# Patient Record
Sex: Female | Born: 1990 | Race: White | Hispanic: No | Marital: Single | Smoking: Former smoker
Health system: Southern US, Community
[De-identification: ages and names within clinical notes are randomized; demographics above are authoritative.]

---
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
## 2011-02-06 ENCOUNTER — Emergency Department: Payer: Self-pay | Admitting: Emergency Medicine

## 2011-03-31 ENCOUNTER — Emergency Department: Payer: Self-pay | Admitting: Unknown Physician Specialty

## 2011-04-10 ENCOUNTER — Inpatient Hospital Stay: Payer: Self-pay | Admitting: Psychiatry

## 2011-07-05 ENCOUNTER — Emergency Department: Payer: Self-pay | Admitting: Emergency Medicine

## 2011-08-07 ENCOUNTER — Emergency Department: Payer: Self-pay | Admitting: Emergency Medicine

## 2011-08-10 ENCOUNTER — Inpatient Hospital Stay: Payer: Self-pay | Admitting: Psychiatry

## 2012-11-26 ENCOUNTER — Ambulatory Visit: Payer: Self-pay | Admitting: Advanced Practice Midwife

## 2012-12-19 ENCOUNTER — Encounter: Payer: Self-pay | Admitting: Obstetrics and Gynecology

## 2013-01-16 ENCOUNTER — Encounter: Payer: Self-pay | Admitting: Maternal & Fetal Medicine

## 2013-01-30 ENCOUNTER — Encounter: Payer: Self-pay | Admitting: Obstetrics & Gynecology

## 2013-02-20 ENCOUNTER — Encounter: Payer: Self-pay | Admitting: Obstetrics and Gynecology

## 2013-05-22 ENCOUNTER — Ambulatory Visit: Payer: Self-pay | Admitting: Family Medicine

## 2013-06-09 ENCOUNTER — Observation Stay: Payer: Self-pay

## 2013-06-17 ENCOUNTER — Observation Stay: Payer: Self-pay | Admitting: Obstetrics and Gynecology

## 2013-06-26 ENCOUNTER — Observation Stay: Payer: Self-pay | Admitting: Obstetrics and Gynecology

## 2013-06-28 ENCOUNTER — Inpatient Hospital Stay: Payer: Self-pay | Admitting: Obstetrics and Gynecology

## 2013-06-28 LAB — CBC WITH DIFFERENTIAL/PLATELET
Basophil #: 0 10*3/uL (ref 0.0–0.1)
Eosinophil #: 0 10*3/uL (ref 0.0–0.7)
Eosinophil %: 0.1 %
HCT: 37.1 % (ref 35.0–47.0)
Lymphocyte #: 1.8 10*3/uL (ref 1.0–3.6)
Lymphocyte %: 15.2 %
MCV: 86 fL (ref 80–100)
Monocyte #: 0.6 x10 3/mm (ref 0.2–0.9)
Monocyte %: 4.9 %
Neutrophil #: 9.2 10*3/uL — ABNORMAL HIGH (ref 1.4–6.5)
Neutrophil %: 79.6 %
Platelet: 212 10*3/uL (ref 150–440)
RBC: 4.32 10*6/uL (ref 3.80–5.20)

## 2013-06-28 LAB — PROTEIN / CREATININE RATIO, URINE
Creatinine, Urine: 262.2 mg/dL — ABNORMAL HIGH (ref 30.0–125.0)
Protein, Random Urine: 16 mg/dL — ABNORMAL HIGH (ref 0–12)
Protein/Creat. Ratio: 61 mg/gCREAT (ref 0–200)

## 2013-06-28 LAB — GC/CHLAMYDIA PROBE AMP

## 2013-06-28 LAB — BASIC METABOLIC PANEL
Calcium, Total: 8.8 mg/dL (ref 8.5–10.1)
Chloride: 109 mmol/L — ABNORMAL HIGH (ref 98–107)
Co2: 21 mmol/L (ref 21–32)
Creatinine: 0.48 mg/dL — ABNORMAL LOW (ref 0.60–1.30)
EGFR (African American): >60
EGFR (Non-African Amer.): >60
Glucose: 115 mg/dL — ABNORMAL HIGH (ref 65–99)
Potassium: 3.6 mmol/L (ref 3.5–5.1)

## 2013-07-01 LAB — PATHOLOGY REPORT

## 2013-11-26 ENCOUNTER — Emergency Department: Payer: Self-pay | Admitting: Emergency Medicine

## 2013-11-26 LAB — BASIC METABOLIC PANEL
Anion Gap: 5 — ABNORMAL LOW (ref 7–16)
BUN: 10 mg/dL (ref 7–18)
Calcium, Total: 9.3 mg/dL (ref 8.5–10.1)
Chloride: 107 mmol/L (ref 98–107)
Co2: 28 mmol/L (ref 21–32)
Creatinine: 0.6 mg/dL (ref 0.60–1.30)
EGFR (Non-African Amer.): >60
GLUCOSE: 89 mg/dL (ref 65–99)
Osmolality: 278 (ref 275–301)
Potassium: 3.7 mmol/L (ref 3.5–5.1)
Sodium: 140 mmol/L (ref 136–145)

## 2013-11-26 LAB — CBC WITH DIFFERENTIAL/PLATELET
BASOS ABS: 0 10*3/uL (ref 0.0–0.1)
BASOS PCT: 0.3 %
Eosinophil #: 0 10*3/uL (ref 0.0–0.7)
Eosinophil %: 0.3 %
HCT: 42.4 % (ref 35.0–47.0)
HGB: 13.9 g/dL (ref 12.0–16.0)
Lymphocyte #: 2.8 10*3/uL (ref 1.0–3.6)
Lymphocyte %: 31.6 %
MCH: 29.3 pg (ref 26.0–34.0)
MCHC: 32.9 g/dL (ref 32.0–36.0)
MCV: 89 fL (ref 80–100)
MONOS PCT: 6.8 %
Monocyte #: 0.6 x10 3/mm (ref 0.2–0.9)
Neutrophil #: 5.4 10*3/uL (ref 1.4–6.5)
Neutrophil %: 61 %
Platelet: 222 10*3/uL (ref 150–440)
RBC: 4.76 10*6/uL (ref 3.80–5.20)
RDW: 12.7 % (ref 11.5–14.5)
WBC: 8.9 10*3/uL (ref 3.6–11.0)

## 2013-11-26 LAB — URINALYSIS, COMPLETE
BACTERIA: NONE SEEN
Bilirubin,UR: NEGATIVE
Glucose,UR: NEGATIVE mg/dL (ref 0–75)
KETONE: NEGATIVE
LEUKOCYTE ESTERASE: NEGATIVE
NITRITE: NEGATIVE
Ph: 6 (ref 4.5–8.0)
Protein: NEGATIVE
Specific Gravity: 1.02 (ref 1.003–1.030)
Squamous Epithelial: 2
WBC UR: 3 /HPF (ref 0–5)

## 2014-06-03 IMAGING — US US OB LIMITED
1 series · 13 of 13 positions shown · non-contrast
Comparison: none

REASON FOR EXAM: CALL REPORT [DATE] Amniotic Fluid Index
COMMENTS:

[Series 1: us ob limited · 0.30mm/px · 13 of 13 slices shown]
[im 1/13]
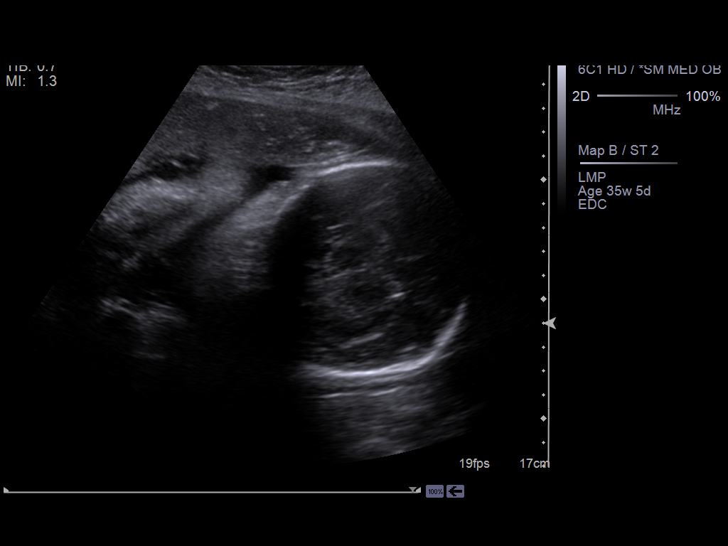
[im 2/13]
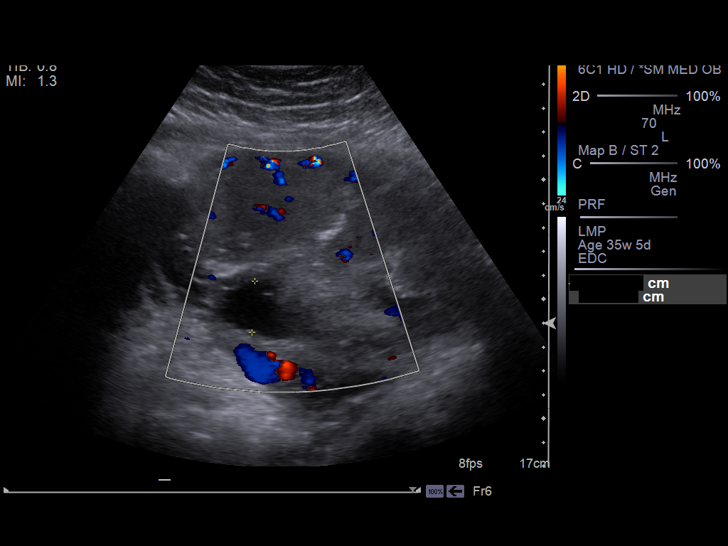
[im 3/13]
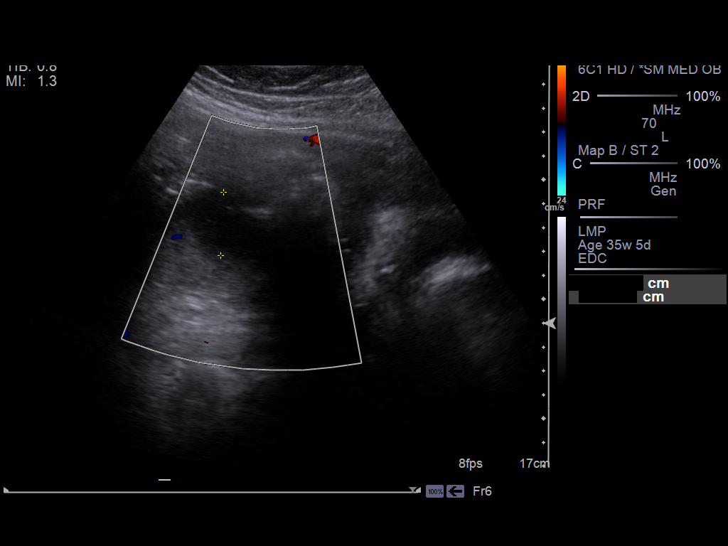
[im 4/13]
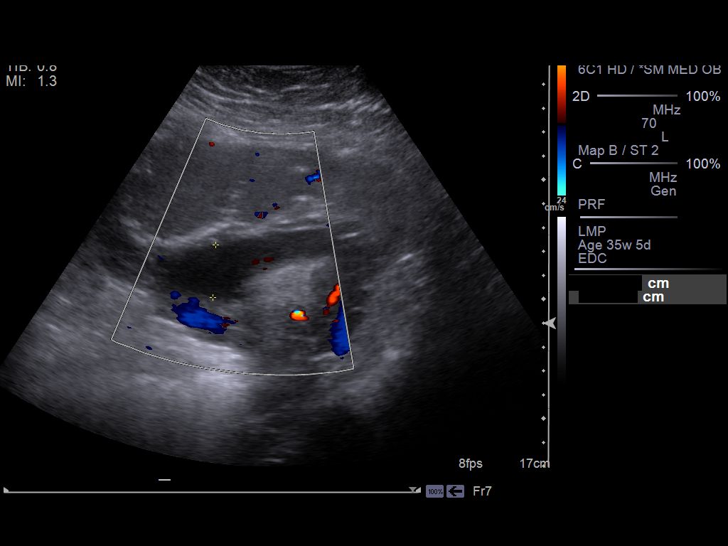
[im 5/13]
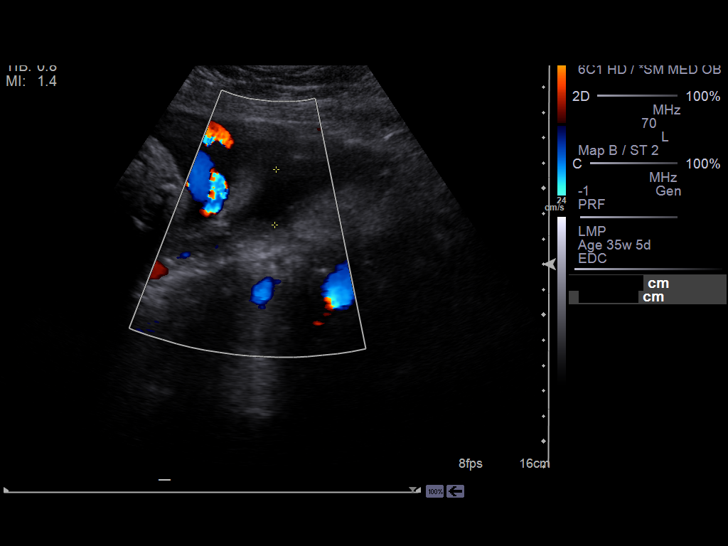
[im 6/13]
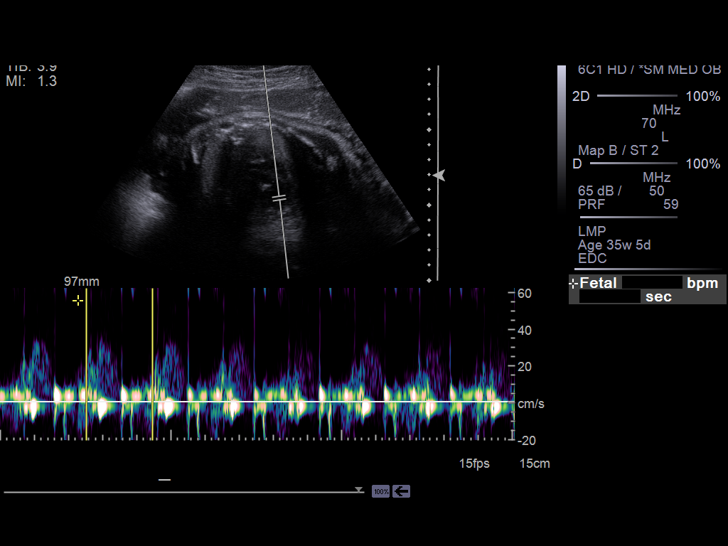
[im 7/13]
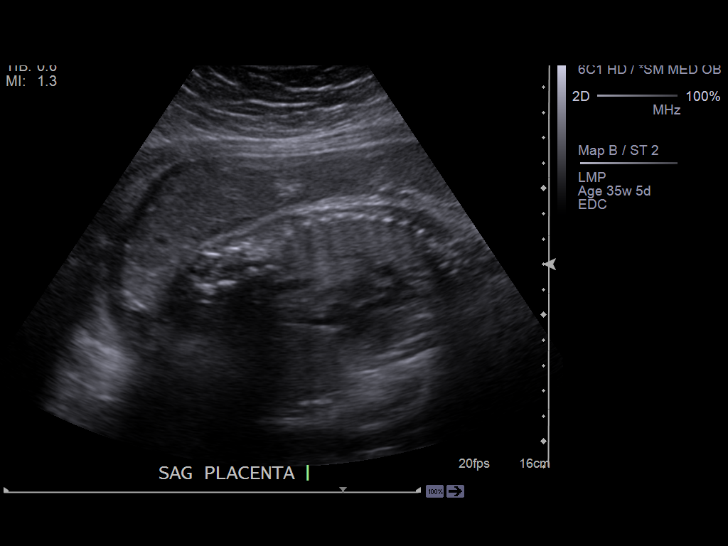
[im 8/13]
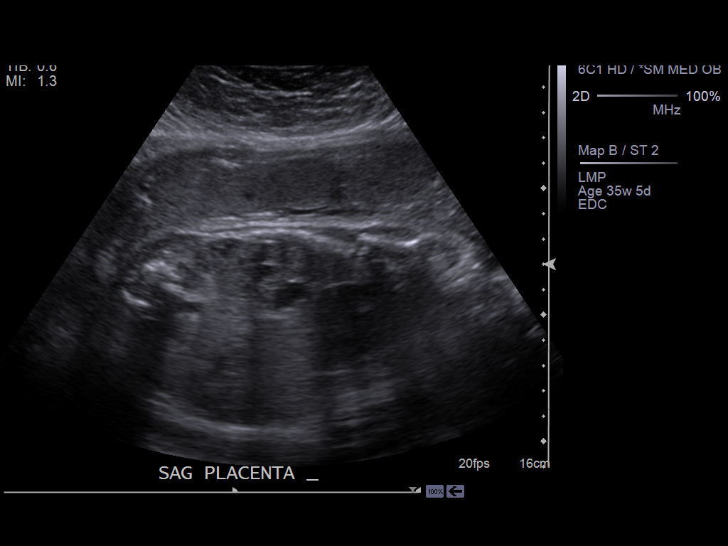
[im 9/13]
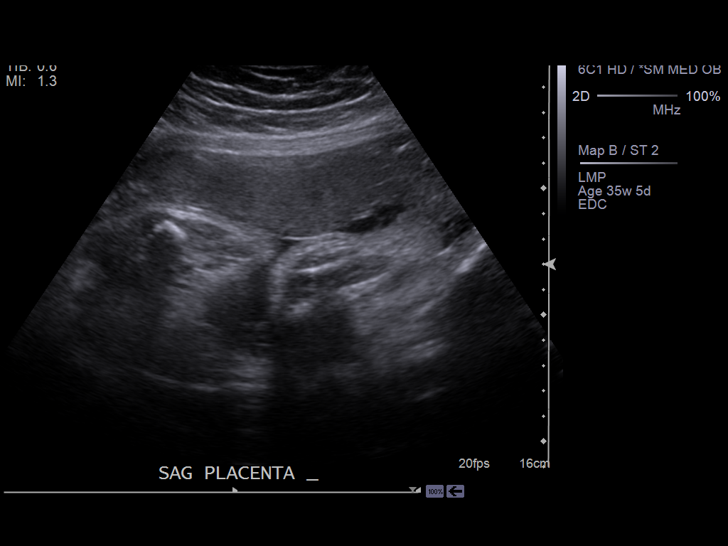
[im 10/13]
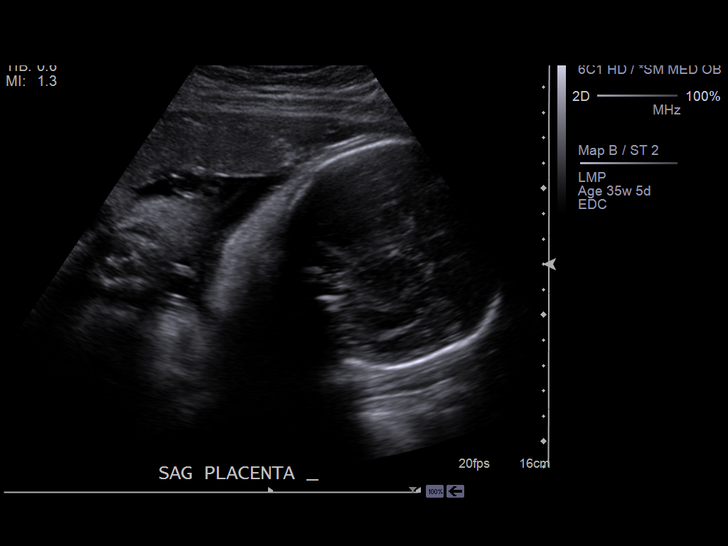
[im 11/13]
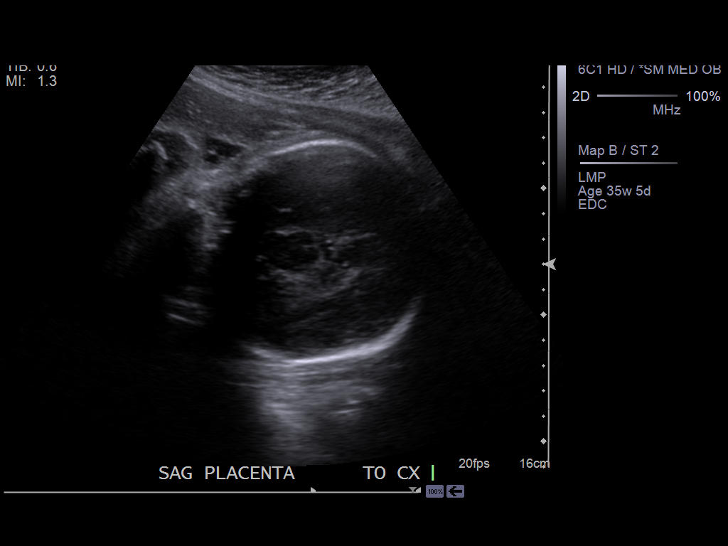
[im 12/13]
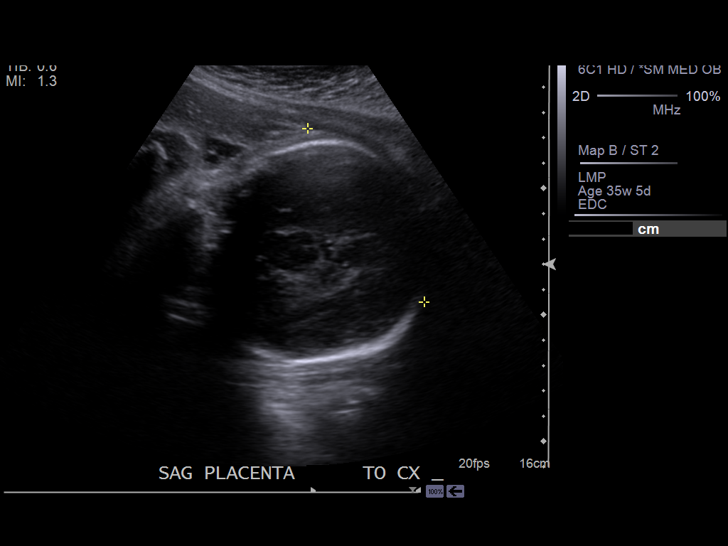
[im 13/13]
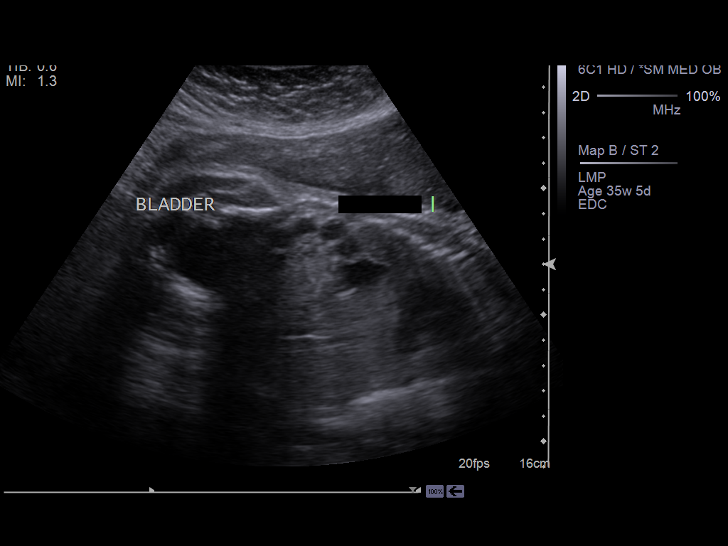

[13 of 13 positions shown; findings below may reference images not displayed]

PROCEDURE:     US  - US LIMITED OB  - May 22, 2013  [DATE]

RESULT:     Limited OB ultrasound is requested and performed with
transabdominal imaging. The request is only for the amniotic fluid index.
The AFI is 9.2 cm. Fetal heart rate is 155 beats per minute. No significant
gross abnormality was demonstrated. Distance from the margin of the placenta
which is anterior in position to the internal cervical os is 8.26 cm. There
appears to be a cephalic presentation.
IMPRESSION: AFI is 9.2 cm. Fetal heart rate is 155 beats per minute.

[REDACTED]

## 2015-02-09 NOTE — H&P (Signed)
L&D Evaluation:  History Expanded:  HPI 24 yo G1 at 4223w3d gestational age by 10 week ultrasound.  Pregnancy complicated by a history of depression (not on medications in pregnancy), marijuana use indicated by urine drug screen.  She also had a UTI (E. coli) treated by macrobid.  She presents for vague symptoms of headache and seeing floaters. She states that she has been undergoing workup in the clinic for high blood pressure issues.  She states that she was seen yesterday and that some blood work was drawn.  However, she called the clinic today and she was unable to get her results.  She denies contractions, leakage of fluid, and vaginal bleeding.  She notes positive fetal movement.  TDaP given 03/27/13   Gravida 1   Blood Type (Maternal) O positive   Group B Strep Results Maternal (Result >5wks must be treated as unknown) positive  in urine culture    Maternal HIV Negative   Maternal Syphilis Ab Nonreactive   Rubella Results (Maternal) no serology (vaccination dates given, 01/05/94, 10/29/97)   [facility]   Patient's Medical History 1) history of depression, 2) history of refulx, 3)    Patient's Surgical History none    Medications Pre Natal Vitamins    Allergies NKDA   Social History history of EtOH and marijuana use    Family History Non-Contributory    ROS:  ROS All systems were reviewed.  HEENT, CNS, GI, GU, Respiratory, CV, Renal and Musculoskeletal systems were found to be normal., unless noted in HPI   Exam:  Vital Signs T 97.3, BP 110-123/52-62, P 90s    Urine Protein negative dipstick   General no apparent distress   Mental Status clear    Abdomen gravid, non-tender   Estimated Fetal Weight Average for gestational age   Edema no edema    Mebranes Intact   FHT normal rate with no decels   FHT Description 135/mod var/+accels/no decels   Ucx once while on monitor   Impression:  Impression 1) Intrauterine pregnancy at 4139 weeks gestational age, 2)  no evidence of gestional hypertension or preeclampsia   Plan:  Plan EFM/NST, discharge   Comments has appt tomorrow at [facility].  Encouraged to follow up then.  Preeclampsia/labor precautions given.   Follow Up Appointment already scheduled   Electronic Signatures: Conard NovakJackson, Shrihaan Porzio D (MD)  (Signed 16-Sep-14 19:17)  Authored: L&D Evaluation   Last Updated: 16-Sep-14 19:17 by Conard NovakJackson, Reynoldo Mainer D (MD)

## 2015-02-09 NOTE — H&P (Signed)
L&D Evaluation:  History:  HPI 24 [facility] with c/o decreased fetal movement at [facility]. FHT were found are were 140 during her appointment. She denies VB, LOF, or CTX.   Presents with decreased fetal movement   Medications Pre Natal Vitamins   Allergies NKDA   Family History Non-Contributory   ROS:  ROS All systems were reviewed.  HEENT, CNS, GI, GU, Respiratory, CV, Renal and Musculoskeletal systems were found to be normal.   Exam:  Vital Signs stable   General no apparent distress   Mental Status clear   Chest clear   Heart normal sinus rhythm   Abdomen gravid, non-tender   Estimated Fetal Weight Average for gestational age   Back no CVAT   Edema no edema   Pelvic no external lesions   Mebranes Intact   FHT normal rate with no decels, category 1 tracing   Ucx absent   Skin dry, no lesions, no rashes   Lymph no lymphadenopathy   Impression:  Impression category 1 FHT, reassuring fetal status. Fetal movement noted by patient. No s/sx of labor   Plan:  Plan discharge   Comments Discussed FKC and monitoring baby's movement with pt.  S/Sx of PTL discussed with pt   Follow Up Appointment need to schedule. at [facility]   Electronic Signatures: Jannet MantisSubudhi, Curstin Schmale (CNM)  (Signed 10-Sep-14 11:47)  Authored: L&D Evaluation   Last Updated: 10-Sep-14 11:47 by Jannet MantisSubudhi, Kynsleigh Westendorf (CNM)

## 2018-01-06 ENCOUNTER — Encounter: Payer: Self-pay | Admitting: Emergency Medicine

## 2018-01-06 ENCOUNTER — Emergency Department
Admission: EM | Admit: 2018-01-06 | Discharge: 2018-01-06 | Disposition: A | Discharge status: Home / Self Care | Payer: Self-pay | Attending: Emergency Medicine | Admitting: Emergency Medicine

## 2018-01-06 DIAGNOSIS — F17200 Nicotine dependence, unspecified, uncomplicated: Secondary | ICD-10-CM | POA: Insufficient documentation

## 2018-01-06 DIAGNOSIS — N750 Cyst of Bartholin's gland: Secondary | ICD-10-CM | POA: Insufficient documentation

## 2018-01-06 MED ORDER — SULFAMETHOXAZOLE-TRIMETHOPRIM 800-160 MG PO TABS: 1.0000 | ORAL_TABLET | Freq: Two times a day (BID) | ORAL | 0 refills | Status: AC

## 2018-01-06 NOTE — ED Triage Notes (Signed)
Patient reports that she has a cyst to the top of her buttocks times three days. Patient states that she has had them in the past and had to have them drained.

## 2018-01-06 NOTE — ED Provider Notes (Signed)
[facility] Emergency Department Provider Note  ____________________________________________  Time seen: Approximately 10:28 PM  I have reviewed the triage vital signs and the nursing notes.   HISTORY  Chief Complaint Cyst    HPI Sarah Krueger is a 27 y.o. female presents to the emergency department with a left-sided Bartholin cyst.  Patient reports that she underwent drainage of Bartholin cyst by self-expression [facility].  Patient reports that Bartholin cyst has reappeared in the same location.  Patient did not follow-up with OB/GYN.  She denies fever, chills, nausea and vomiting.  No alleviating measures have been attempted.  History reviewed. No pertinent past medical history.  There are no active problems to display for this patient.   History reviewed. No pertinent surgical history.  Prior to Admission medications   Medication Sig Start Date End Date Taking? Authorizing Provider  sulfamethoxazole-trimethoprim (BACTRIM DS,SEPTRA DS) 800-160 MG tablet Take 1 tablet by mouth 2 (two) times daily for 7 days. 01/06/18 01/13/18  Orvil FeilWoods, Arthuro Canelo M, PA-C    Allergies Patient has no known allergies.  No family history on file.  Social History Social History   Tobacco Use  . Smoking status: Current Every Day Smoker  . Smokeless tobacco: Never Used  Substance Use Topics  . Alcohol use: Not on file  . Drug use: Not on file     Review of Systems  Constitutional: No fever/chills Eyes: No visual changes. No discharge ENT: No upper respiratory complaints. Cardiovascular: no chest pain. Respiratory: no cough. No SOB. Gastrointestinal: No abdominal pain.  No nausea, no vomiting.  No diarrhea.  No constipation. Genitourinary: Negative for dysuria. No hematuria Musculoskeletal: Negative for musculoskeletal pain. Skin: Patient has Bartholin cyst Neurological: Negative for headaches, focal weakness or  numbness.   ____________________________________________   PHYSICAL EXAM:  VITAL SIGNS: ED Triage Vitals  Enc Vitals Group     BP 01/06/18 1927 (!) 139/98     Pulse Rate 01/06/18 1927 86     Resp 01/06/18 1927 18     Temp 01/06/18 1927 98.4 F (36.9 C)     Temp Source 01/06/18 1927 Oral     SpO2 01/06/18 1927 100 %     Weight 01/06/18 1928 162 lb (73.5 kg)     Height 01/06/18 1928 5\' 1"  (1.549 m)     Head Circumference --      Peak Flow --      Pain Score 01/06/18 1927 10     Pain Loc --      Pain Edu? --      Excl. in GC? --      Constitutional: Alert and oriented. Well appearing and in no acute distress. Eyes: Conjunctivae are normal. PERRL. EOMI. Head: Atraumatic. Cardiovascular: Normal rate, regular rhythm. Normal S1 and S2.  Good peripheral circulation. Respiratory: Normal respiratory effort without tachypnea or retractions. Lungs CTAB. Good air entry to the bases with no decreased or absent breath sounds. Gastrointestinal: Bowel sounds 4 quadrants. Soft and nontender to palpation. No guarding or rigidity. No palpable masses. No distention. No CVA tenderness. Musculoskeletal: Full range of motion to all extremities. No gross deformities appreciated. Neurologic:  Normal speech and language. No gross focal neurologic deficits are appreciated.  Skin: Patient has left-sided Bartholin cyst that is spontaneously draining in the emergency department. Psychiatric: Mood and affect are normal. Speech and behavior are normal. Patient exhibits appropriate insight and judgement.   ____________________________________________   LABS (all labs ordered are  listed, but only abnormal results are displayed)  Labs Reviewed - No data to display ____________________________________________  EKG   ____________________________________________  RADIOLOGY   No results found.  ____________________________________________    PROCEDURES  Procedure(s) performed:     Procedures    Medications - No data to display   ____________________________________________   INITIAL IMPRESSION / ASSESSMENT AND PLAN / ED COURSE  Pertinent labs & imaging results that were available during my care of the patient were reviewed by me and considered in my medical decision making (see chart for details).  Review of the [place] CSRS was performed in accordance of the [facility] prior to dispensing any controlled drugs.     Assessment and plan Bartholin cyst Differential diagnosis includes abscess, cellulitis and Bartholin cyst Patient presents to the emergency department with a spontaneously draining Bartholin cyst.  Patient reported that Bartholin cyst started draining after she was coughing in the emergency department.  I offered to make an incision and insert a Word catheter but patient refused.  I offered patient education that Bartholin cyst would return without catheter placement according to evidence-based care.  Patient voiced understanding.  She was discharged with Bactrim and advised to follow-up with gynecology.  Vital signs are reassuring prior to discharge.  All patient questions were answered.    ____________________________________________  FINAL CLINICAL IMPRESSION(S) / ED DIAGNOSES  Final diagnoses:  Bartholin cyst      NEW MEDICATIONS STARTED DURING THIS VISIT:  ED Discharge Orders        Ordered    sulfamethoxazole-trimethoprim (BACTRIM DS,SEPTRA DS) 800-160 MG tablet  2 times daily     01/06/18 2107          This chart was dictated using voice recognition software/Dragon. Despite best efforts to proofread, errors can occur which can change the meaning. Any change was purely unintentional.    Orvil Feil, PA-C 01/06/18 2231    Don Perking, [place], MD 01/06/18 2258

## 2019-01-17 ENCOUNTER — Emergency Department: Payer: Self-pay

## 2019-01-17 ENCOUNTER — Ambulatory Visit: Payer: Self-pay

## 2019-01-17 ENCOUNTER — Other Ambulatory Visit: Payer: Self-pay

## 2019-01-17 ENCOUNTER — Encounter: Payer: Self-pay | Admitting: *Deleted

## 2019-01-17 ENCOUNTER — Emergency Department
Admission: EM | Admit: 2019-01-17 | Discharge: 2019-01-17 | Disposition: A | Discharge status: Home / Self Care | Payer: Self-pay | Attending: Emergency Medicine | Admitting: Emergency Medicine

## 2019-01-17 DIAGNOSIS — Z87891 Personal history of nicotine dependence: Secondary | ICD-10-CM | POA: Insufficient documentation

## 2019-01-17 DIAGNOSIS — N83209 Unspecified ovarian cyst, unspecified side: Secondary | ICD-10-CM

## 2019-01-17 DIAGNOSIS — R102 Pelvic and perineal pain: Secondary | ICD-10-CM | POA: Insufficient documentation

## 2019-01-17 DIAGNOSIS — R1030 Lower abdominal pain, unspecified: Secondary | ICD-10-CM | POA: Insufficient documentation

## 2019-01-17 DIAGNOSIS — N83292 Other ovarian cyst, left side: Secondary | ICD-10-CM | POA: Insufficient documentation

## 2019-01-17 LAB — CBC
HCT: 45.2 % (ref 36.0–46.0)
Hemoglobin: 15.2 g/dL — ABNORMAL HIGH (ref 12.0–15.0)
MCH: 30 pg (ref 26.0–34.0)
MCHC: 33.6 g/dL (ref 30.0–36.0)
MCV: 89.2 fL (ref 80.0–100.0)
Platelets: 286 10*3/uL (ref 150–400)
RBC: 5.07 MIL/uL (ref 3.87–5.11)
RDW: 12 % (ref 11.5–15.5)
WBC: 16.1 10*3/uL — ABNORMAL HIGH (ref 4.0–10.5)
nRBC: 0 % (ref 0.0–0.2)

## 2019-01-17 LAB — COMPREHENSIVE METABOLIC PANEL
ALT: 33 U/L (ref 0–44)
AST: 38 U/L (ref 15–41)
Albumin: 4.6 g/dL (ref 3.5–5.0)
Alkaline Phosphatase: 67 U/L (ref 38–126)
Anion gap: 11 (ref 5–15)
BUN: 11 mg/dL (ref 6–20)
CO2: 21 mmol/L — ABNORMAL LOW (ref 22–32)
Calcium: 9.3 mg/dL (ref 8.9–10.3)
Chloride: 107 mmol/L (ref 98–111)
Creatinine, Ser: 0.6 mg/dL (ref 0.44–1.00)
GFR calc Af Amer: >60 mL/min (ref >60)
GFR calc non Af Amer: >60 mL/min (ref >60)
Glucose, Bld: 136 mg/dL — ABNORMAL HIGH (ref 70–99)
Potassium: 3.5 mmol/L (ref 3.5–5.1)
Sodium: 139 mmol/L (ref 135–145)
Total Bilirubin: 0.5 mg/dL (ref 0.3–1.2)
Total Protein: 8.5 g/dL — ABNORMAL HIGH (ref 6.5–8.1)

## 2019-01-17 LAB — URINALYSIS, COMPLETE (UACMP) WITH MICROSCOPIC
Bacteria, UA: NONE SEEN
Bilirubin Urine: NEGATIVE
Glucose, UA: >=500 mg/dL — AB
Hgb urine dipstick: NEGATIVE
Ketones, ur: NEGATIVE mg/dL
Leukocytes,Ua: NEGATIVE
Nitrite: NEGATIVE
Protein, ur: NEGATIVE mg/dL
Specific Gravity, Urine: 1.029 (ref 1.005–1.030)
pH: 5 (ref 5.0–8.0)

## 2019-01-17 LAB — POCT PREGNANCY, URINE: Preg Test, Ur: NEGATIVE

## 2019-01-17 LAB — LIPASE, BLOOD: Lipase: 28 U/L (ref 11–51)

## 2019-01-17 MED ORDER — DICYCLOMINE HCL 20 MG PO TABS: 20.0000 mg | ORAL_TABLET | Freq: Three times a day (TID) | ORAL | 0 refills | Status: AC | PRN

## 2019-01-17 MED ORDER — SODIUM CHLORIDE 0.9% FLUSH: 3.0000 mL | Freq: Once | INTRAVENOUS | Status: DC

## 2019-01-17 MED ORDER — SODIUM CHLORIDE 0.9 % IV BOLUS
1000.0000 mL | Freq: Once | INTRAVENOUS | Status: AC
  Administered 2019-01-17: 1000 mL via INTRAVENOUS

## 2019-01-17 MED ORDER — HALOPERIDOL LACTATE 5 MG/ML IJ SOLN
5.0000 mg | Freq: Once | INTRAMUSCULAR | Status: AC
  Administered 2019-01-17: 5 mg via INTRAVENOUS
  Filled 2019-01-17: qty 1

## 2019-01-17 MED ORDER — IOHEXOL 300 MG/ML  SOLN
100.0000 mL | Freq: Once | INTRAMUSCULAR | Status: AC | PRN
  Administered 2019-01-17: 19:00:00 100 mL via INTRAVENOUS

## 2019-01-17 MED ORDER — SUCRALFATE 1 G PO TABS: 1.0000 g | ORAL_TABLET | Freq: Four times a day (QID) | ORAL | 0 refills | Status: AC

## 2019-01-17 NOTE — ED Triage Notes (Signed)
Pt presents w/ c/o lower abdominal pain starting this morning @ 0800. Pt c/o vomiting x 2 Pt states her vomited smelled like feces. Pt states last BM yesterday morning and it was hard formed. Pt states she has been treating constipation w/ OTC laxatives and an enema w/ poor results x 5 days.

## 2019-01-17 NOTE — ED Notes (Signed)
Pt is going to ultrasound.   

## 2019-01-17 NOTE — ED Notes (Signed)
Pt is resting in bed. Respirations even/unlabored. Pt is c/o abd pain, pain meds were given at this time.

## 2019-01-17 NOTE — ED Provider Notes (Signed)
[facility] Emergency Department Provider Note   ____________________________________________   I have reviewed the triage vital signs and the nursing notes.   HISTORY  Chief Complaint Abdominal Pain   History limited by: Not Limited   HPI Sarah Krueger is a 28 y.o. female who presents to the emergency department today because of concern for abdominal pain. The patient states that for the past few days she has been constipated. She has been trying stool softeners and magnesium citrate without any relief. Did have a small bowel movement yesterday. Today her discomfort in the lower abdomen has increased and been more severe. She has had some associated vomiting with the pain and constipation. She denies any fevers. Thinks that the constipation might be related to percocet she took for a tooth ache. Denies any history of intestinal disorder.    Records reviewed. Per medical record review patient has a history of no known drug allergies.   History reviewed. No pertinent past medical history.  There are no active problems to display for this patient.   History reviewed. No pertinent surgical history.  Prior to Admission medications   Not on File    Allergies Patient has no known allergies.  History reviewed. No pertinent family history.  Social History Social History   Tobacco Use  . Smoking status: Former Games developer  . Smokeless tobacco: Never Used  Substance Use Topics  . Alcohol use: Yes  . Drug use: Yes    Types: Marijuana    Review of Systems Constitutional: No fever/chills Eyes: No visual changes. ENT: No sore throat. Cardiovascular: Denies chest pain. Respiratory: Denies shortness of breath. Gastrointestinal: Positive for abdominal pain, constipation, nausea and vomiting.  Genitourinary: Negative for dysuria. Musculoskeletal: Negative for back pain. Skin: Negative for rash. Neurological: Negative for headaches, focal weakness or  numbness.  ____________________________________________   PHYSICAL EXAM:  VITAL SIGNS: ED Triage Vitals  Enc Vitals Group     BP 01/17/19 1638 (!) 138/105     Pulse Rate 01/17/19 1638 82     Resp 01/17/19 1638 20     Temp 01/17/19 1638 98.3 F (36.8 C)     Temp Source 01/17/19 1638 Oral     SpO2 01/17/19 1638 100 %     Weight 01/17/19 1633 170 lb (77.1 kg)     Height 01/17/19 1633 5\' 2"  (1.575 m)     Head Circumference --      Peak Flow --      Pain Score 01/17/19 1632 10   Constitutional: Alert and oriented.  Eyes: Conjunctivae are normal.  ENT      Head: Normocephalic and atraumatic.      Nose: No congestion/rhinnorhea.      Mouth/Throat: Mucous membranes are moist.      Neck: No stridor. Hematological/Lymphatic/Immunilogical: No cervical lymphadenopathy. Cardiovascular: Normal rate, regular rhythm.  No murmurs, rubs, or gallops.  Respiratory: Normal respiratory effort without tachypnea nor retractions. Breath sounds are clear and equal bilaterally. No wheezes/rales/rhonchi. Gastrointestinal: Soft and tender to palpation in the lower abdomen.  Genitourinary: Deferred Musculoskeletal: Normal range of motion in all extremities. No lower extremity edema. Neurologic:  Normal speech and language. No gross focal neurologic deficits are appreciated.  Skin:  Skin is warm, dry and intact. No rash noted. Psychiatric: Mood and affect are normal. Speech and behavior are normal. Patient exhibits appropriate insight and judgment.  ____________________________________________    LABS (pertinent positives/negatives)  Upreg negative CBC wbc 16.1, hgb 15.2, plt 286 UA turbid,  glucose >500, wbc 6-10, squamous epi 6-10 CMP wnl except co2 21, glu 136, t pro 8.5 ____________________________________________   EKG  None  ____________________________________________    RADIOLOGY  CT abd/pel Ovarian cyst. No other concerning acute pathology.  US pelvis No  torsion  ____________________________________________   PROCEDURES  Procedures  ____________________________________________   INITIAL IMPRESSION / ASSESSMENT AND PLAN / ED COURSE  Pertinent labs & imaging results that were available during my care of the patient were reviewed by me and considered in my medical decision making (see chart for details).   Patient complaining of constipation and lower abdominal pain. Mild leukocytosis of 16. CT abd/pel was performed which did not show appendicitis or other intraabdominal infection. Did show ovarian cyst. Given concern for possible associated torsion US was performed which was not consistent with torsion. Discussed with the patient findings. Discussed importance of follow up with ob/gyn.   ____________________________________________   FINAL CLINICAL IMPRESSION(S) / ED DIAGNOSES  Final diagnoses:  Lower abdominal pain  Cyst of ovary, unspecified laterality     Note: This dictation was prepared with Dragon dictation. Any transcriptional errors that result from this process are unintentional     Phineas SemenGoodman, Nicholl Onstott, MD 01/17/19 2151

## 2019-01-17 NOTE — Telephone Encounter (Signed)
Patient called and says she's having abdominal pain due to constipation. She says she's been taking Citrate of Magnesium and OTC laxatives, she had a small BM yesterday, but woke up this morning in severe pain. Patient says she hurts so bad she feels like she's going to pass out. I advised to go to the ED with one person due to restrictions, she verbalized understanding.

## 2019-01-17 NOTE — Discharge Instructions (Addendum)
Please seek medical attention for any high fevers, chest pain, shortness of breath, change in behavior, persistent vomiting, bloody stool or any other new or concerning symptoms.  

## 2019-09-04 ENCOUNTER — Other Ambulatory Visit: Payer: Self-pay

## 2019-09-04 DIAGNOSIS — Z20822 Contact with and (suspected) exposure to covid-19: Secondary | ICD-10-CM

## 2019-09-06 LAB — NOVEL CORONAVIRUS, NAA: SARS-CoV-2, NAA: NOT DETECTED

## 2020-01-29 IMAGING — US US PELVIS COMPLETE WITH TRANSVAGINAL
1 series · 13 of 25 positions shown · non-contrast
Comparison: None.

CLINICAL DATA: Lower abdominal pain

EXAM:
TRANSABDOMINAL AND TRANSVAGINAL ULTRASOUND OF PELVIS
DOPPLER ULTRASOUND OF OVARIES
TECHNIQUE: Both transabdominal and transvaginal ultrasound examinations of the
pelvis were performed. Transabdominal technique was performed for
global imaging of the pelvis including uterus, ovaries, adnexal
regions, and pelvic cul-de-sac.
It was necessary to proceed with endovaginal exam following the
transabdominal exam to visualize the endometrium and ovaries. Color
and duplex Doppler ultrasound was utilized to evaluate blood flow to
the ovaries.

[Series 1: us pelvis complete with transvaginal · 13 of 87 slices shown]
[im 1/87]
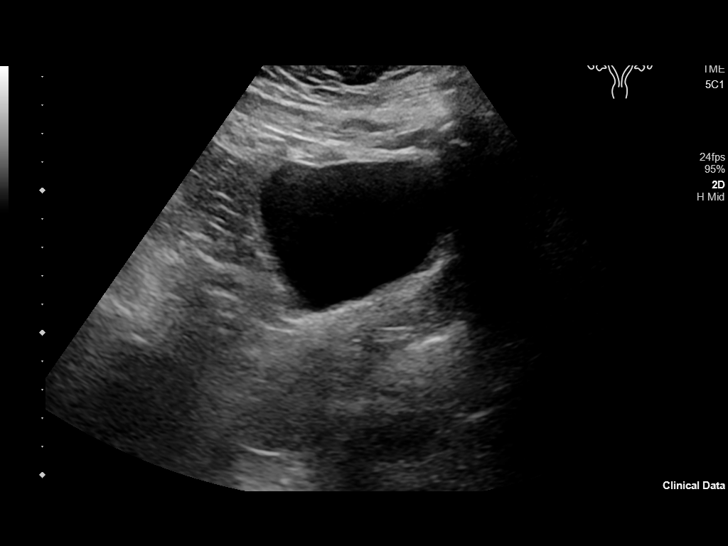
[im 8/87]
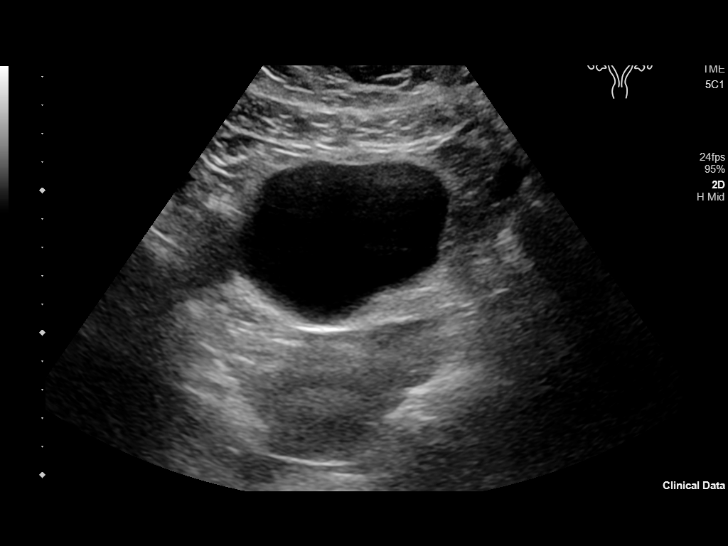
[im 15/87]
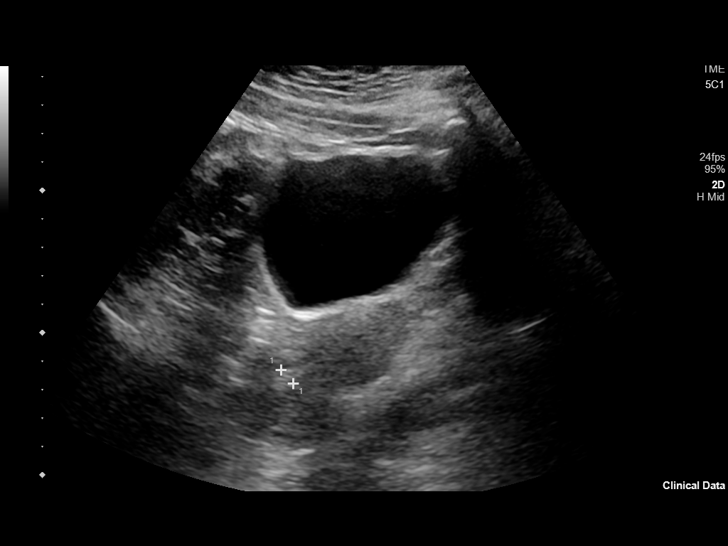
[im 22/87]
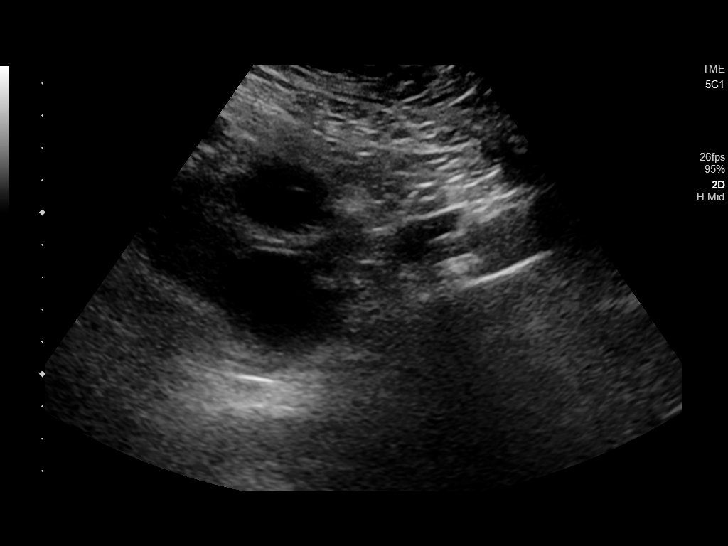
[im 29/87]
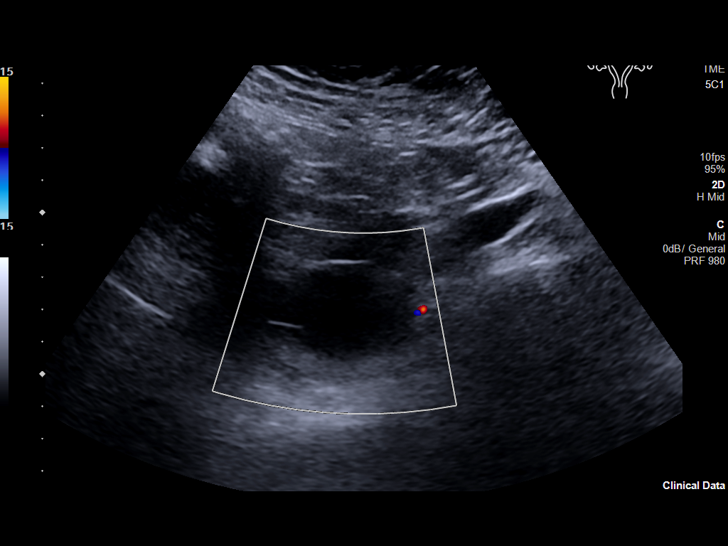
[im 36/87]
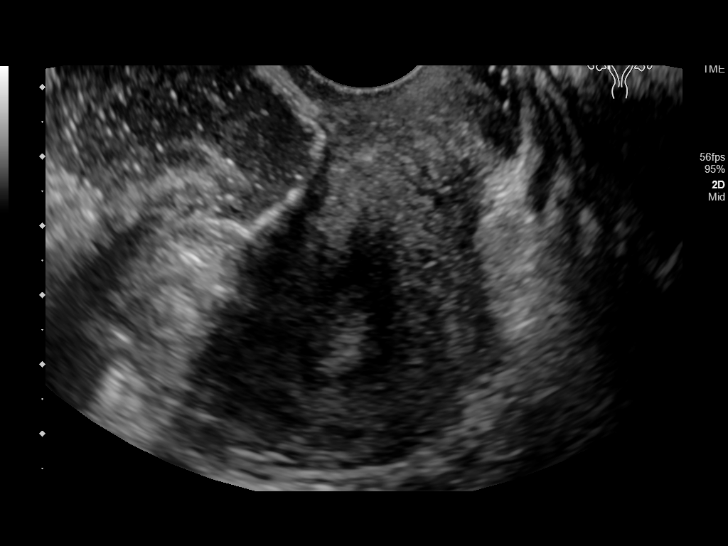
[im 44/87]
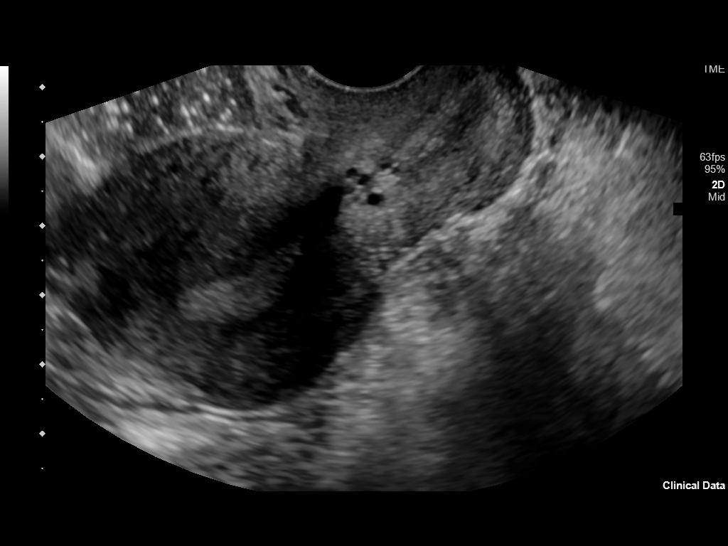
[im 51/87]
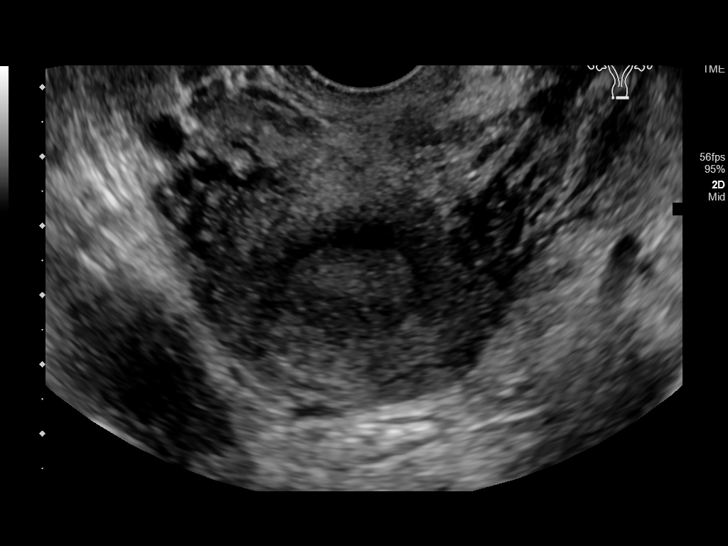
[im 58/87]
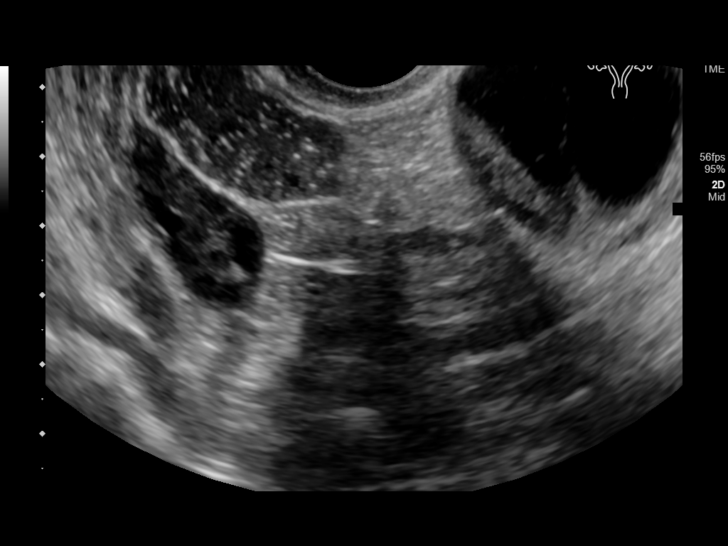
[im 65/87]
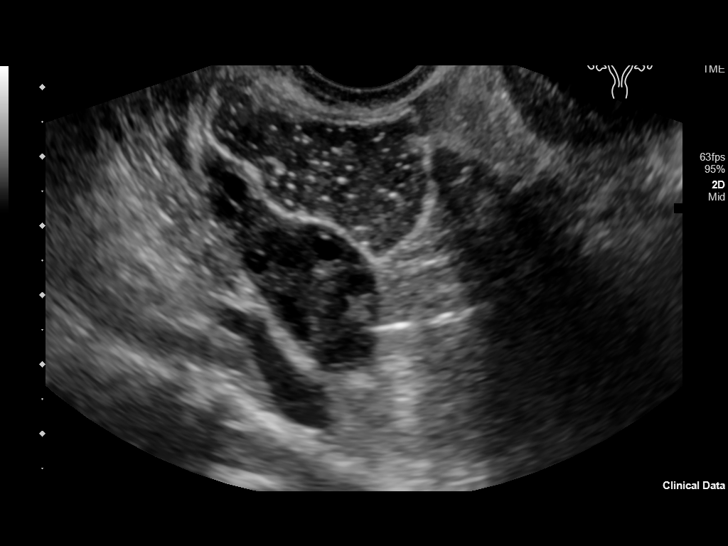
[im 72/87]
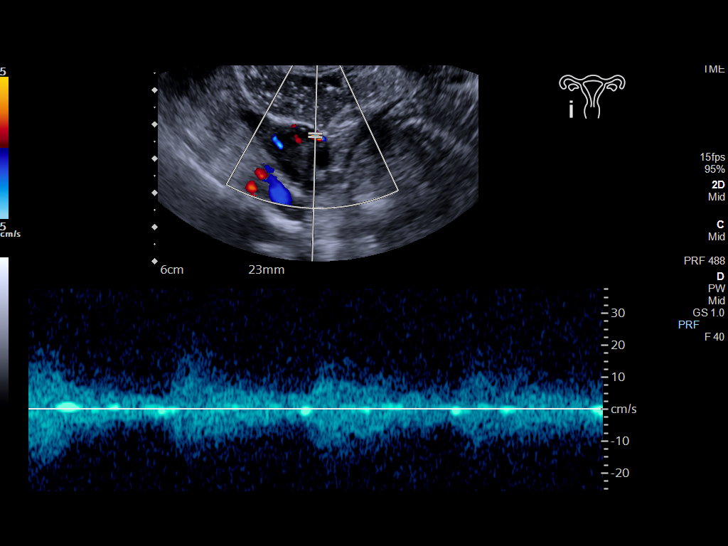
[im 79/87]
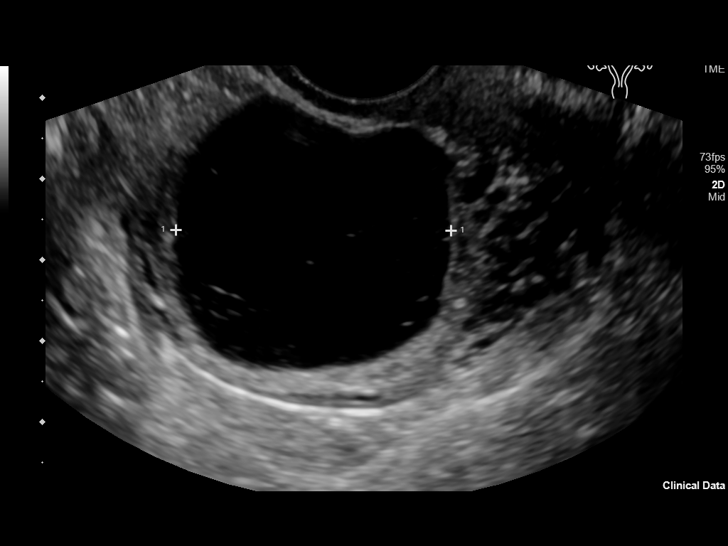
[im 87/87]
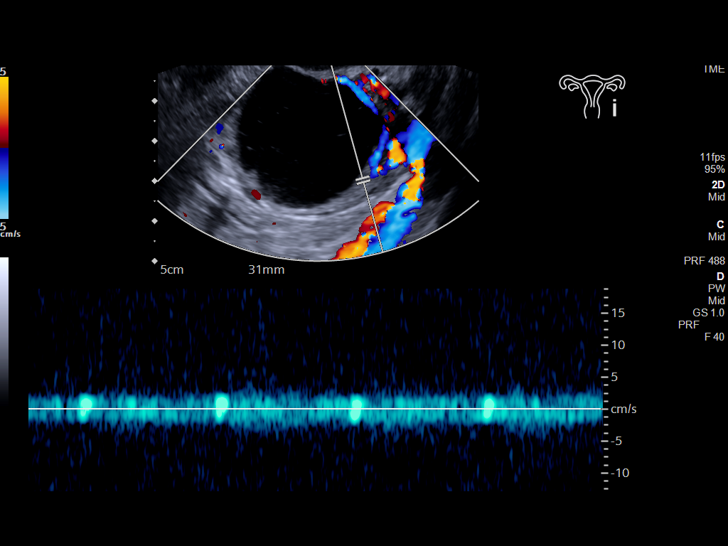

[13 of 25 positions shown; findings below may reference images not displayed]

FINDINGS: Uterus

Measurements: 6.6 x 3.4 x 4.3 cm = volume: 50 mL. No fibroids or
other mass visualized.

Endometrium

Thickness: 6.6 mm.  No focal abnormality visualized.

Right ovary

Measurements: 3 x 1.6 x 2.3 cm = volume: 5.6 mL. Normal
appearance/no adnexal mass.

Left ovary

Measurements: 4.2 x 3.6 x 3.9 cm = volume: 31 mL. 3.4 x 3.2 x 3.5 cm
hypoechoic left ovarian mass with small amount of debris and
multiple thin septation likely reflecting a hemorrhagic cyst.

Pulsed Doppler evaluation of both ovaries demonstrates normal
low-resistance arterial and venous waveforms.

Other findings

No abnormal free fluid.
IMPRESSION: 1. No ovarian torsion.
2. 3.4 x 3.2 x 3.5 cm hypoechoic left ovarian mass with small amount
of debris and multiple thin septation likely reflecting a
hemorrhagic cyst. Short-interval follow up ultrasound in 6-12 weeks
is recommended, preferably during the week following the patient's
normal menses.

## 2020-01-29 IMAGING — CT CT ABDOMEN AND PELVIS WITH CONTRAST
2 of 4 series · 15 of 46 positions shown, 17 images · IV contrast (APPLIED)
Comparison: 07/06/2011.

CLINICAL DATA: Lower abdominal pain with vomiting.

EXAM:
CT ABDOMEN AND PELVIS WITH CONTRAST
TECHNIQUE: Multidetector CT imaging of the abdomen and pelvis was performed
using the standard protocol following bolus administration of
intravenous contrast.
CONTRAST:  100mL OMNIPAQUE IOHEXOL 300 MG/ML  SOLN

[Series 2: axial st · axial · 0.68mm/px · z∈[-477,-42]mm · 12 of 99 slices shown, 14 images]
[im 8/99  soft-tissue]
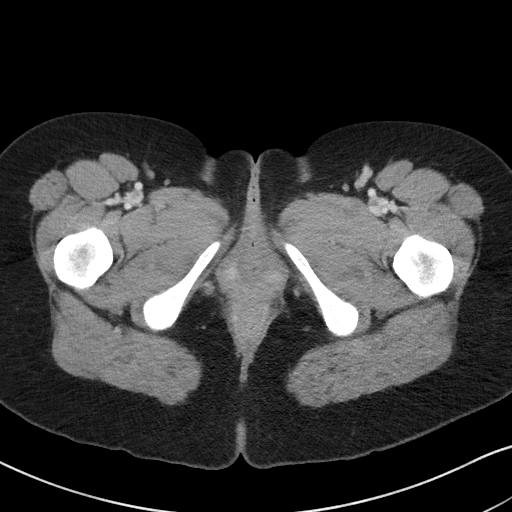
[im 8/99  bone]
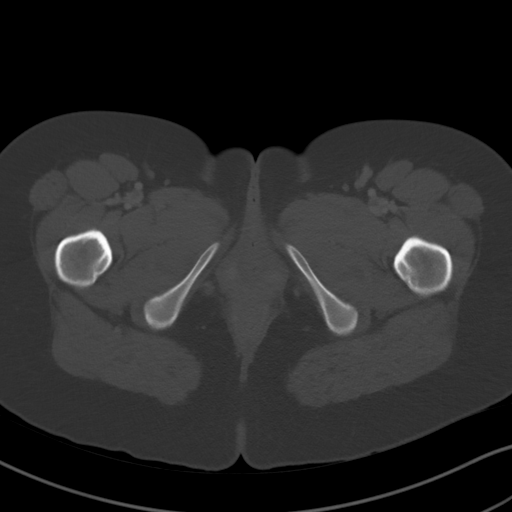
[im 16/99  soft-tissue]
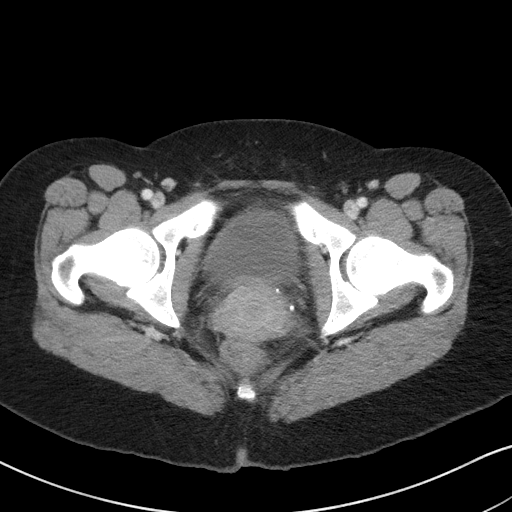
[im 24/99  soft-tissue]
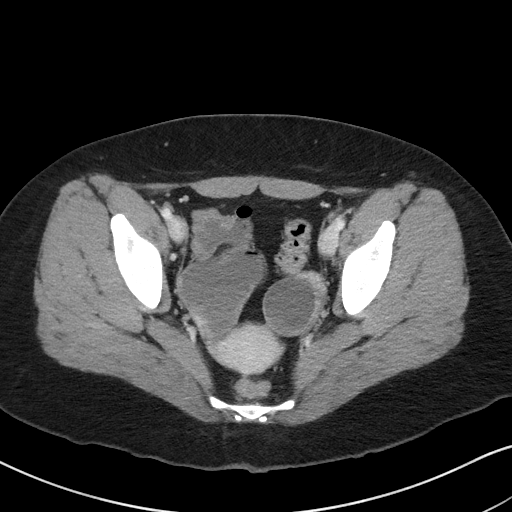
[im 32/99  soft-tissue]
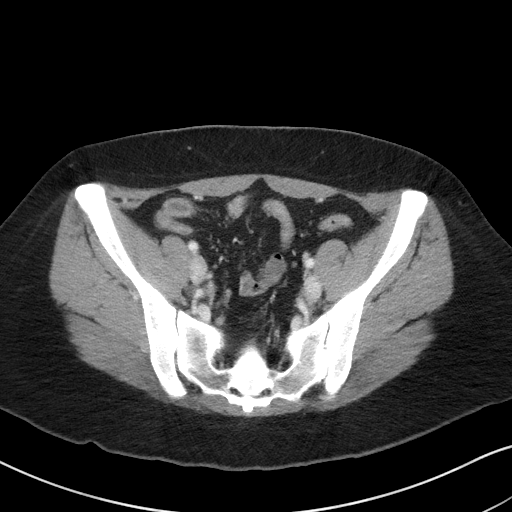
[im 40/99  soft-tissue]
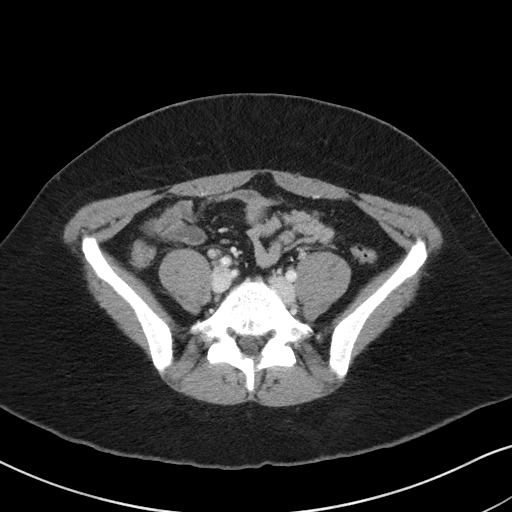
[im 48/99  soft-tissue]
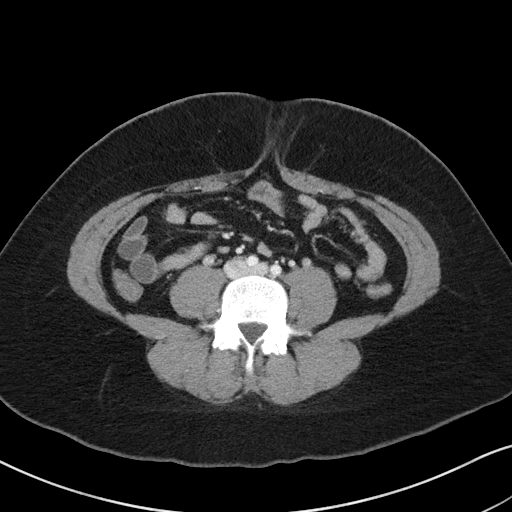
[im 55/99  soft-tissue]
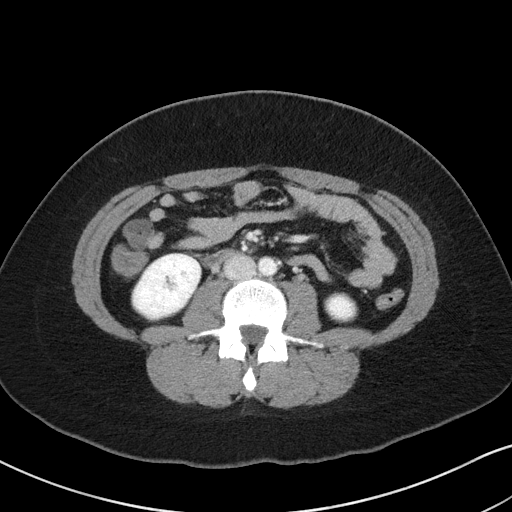
[im 63/99  soft-tissue]
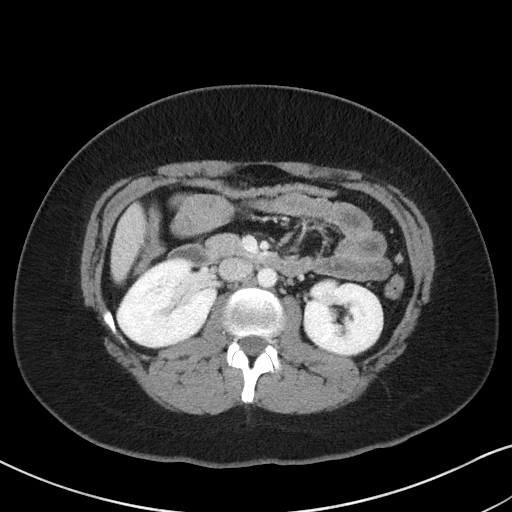
[im 71/99  soft-tissue]
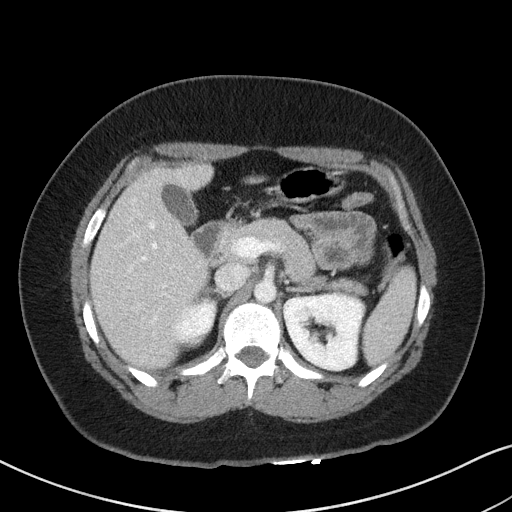
[im 71/99  bone]
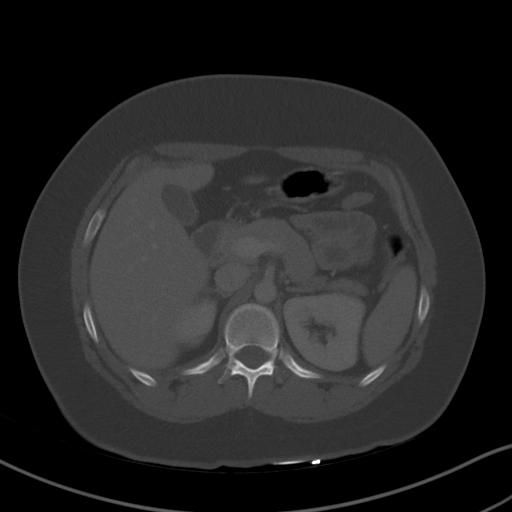
[im 79/99  soft-tissue]
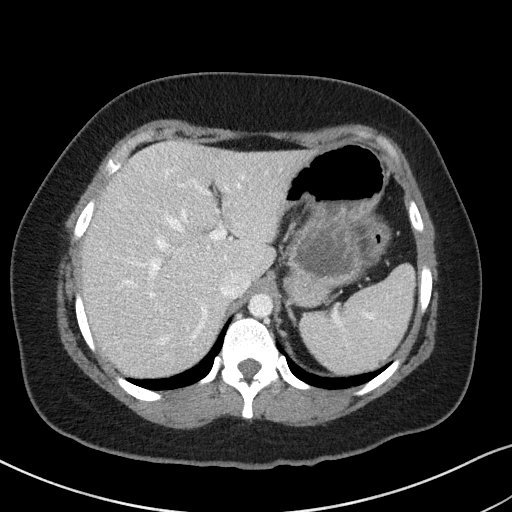
[im 87/99  soft-tissue]
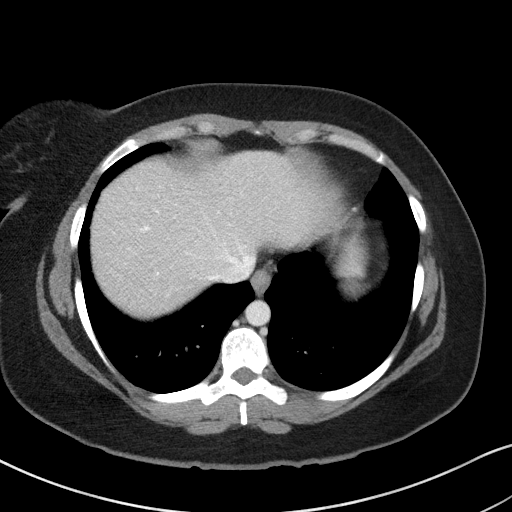
[im 95/99  soft-tissue]
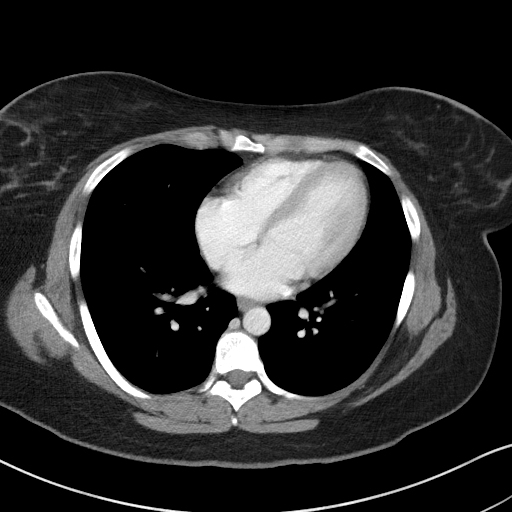

[Series 5: coronal st · coronal · 0.63mm/px · 3 of 86 slices shown]
[im 29/86  soft-tissue]
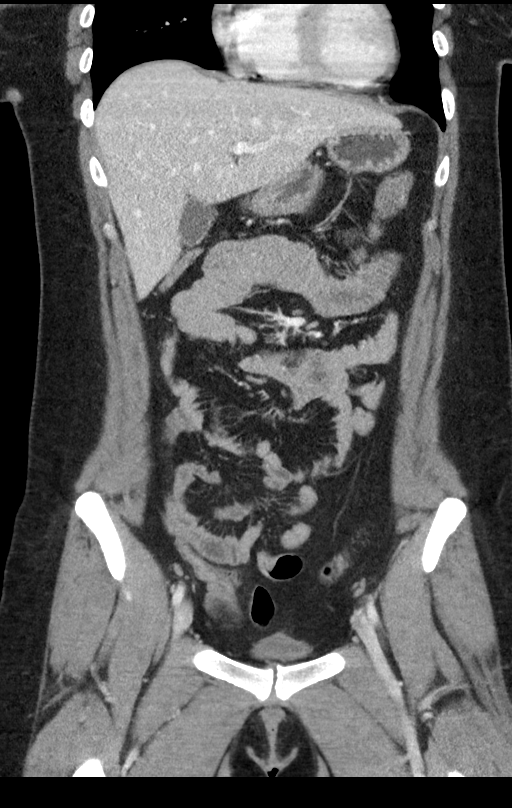
[im 38/86  soft-tissue]
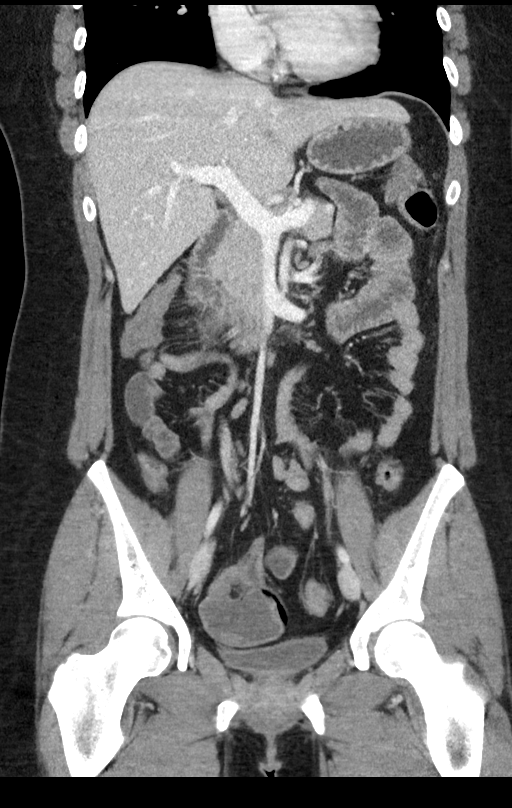
[im 48/86  soft-tissue]
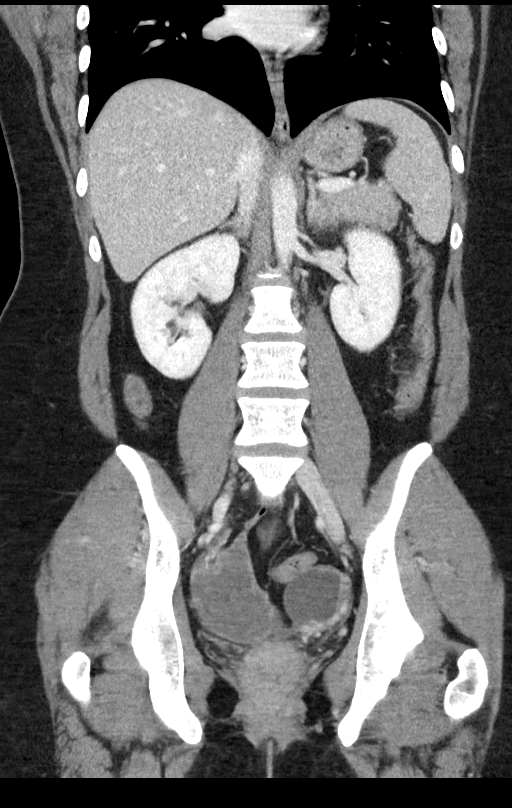

[15 of 46 positions shown; findings below may reference images not displayed]

FINDINGS: Lower chest: Unremarkable.

Hepatobiliary: No suspicious focal abnormality within the liver
parenchyma. There is no evidence for gallstones, gallbladder wall
thickening, or pericholecystic fluid. No intrahepatic or
extrahepatic biliary dilation.

Pancreas: No focal mass lesion. No dilatation of the main duct. No
intraparenchymal cyst. No peripancreatic edema.

Spleen: No splenomegaly. No focal mass lesion.

Adrenals/Urinary Tract: No adrenal nodule or mass. Kidneys
unremarkable. No evidence for hydroureter. The urinary bladder
appears normal for the degree of distention.

Stomach/Bowel: Stomach is unremarkable. No gastric wall thickening.
No evidence of outlet obstruction. Duodenum is normally positioned
as is the ligament of Treitz. No small bowel wall thickening. No
small bowel dilatation. The terminal ileum is normal. The appendix
is normal. Colon is diffusely decompressed without appreciable stool
volume.

Vascular/Lymphatic: No abdominal aortic aneurysm. No abdominal
aortic atherosclerotic calcification. There is no gastrohepatic or
hepatoduodenal ligament lymphadenopathy. No intraperitoneal or
retroperitoneal lymphadenopathy. No pelvic sidewall lymphadenopathy.

Reproductive: The uterus is unremarkable. 4.0 cm simple appearing
cyst identified in the left ovary. No right adnexal mass.

Other: No intraperitoneal free fluid.

Musculoskeletal: No worrisome lytic or sclerotic osseous
abnormality. Small sclerotic focus in the T10 spinous process,
likely bone island.
IMPRESSION: 1. No acute findings in the abdomen or pelvis. No findings to
explain the patient's history of lower abdominal pain with vomiting.
2. 4 cm benign appearing left ovarian cyst. For a patient of this
age, a benign appearing cyst of this size requires no follow-up.
This recommendation follows ACR consensus guidelines: White Paper of
the ACR Incidental Findings Committee II on Adnexal Findings. [HOSPITAL] [DATE].

## 2020-02-19 ENCOUNTER — Ambulatory Visit: Payer: Medicaid Other

## 2020-02-19 DIAGNOSIS — Z23 Encounter for immunization: Secondary | ICD-10-CM

## 2020-02-19 NOTE — Progress Notes (Signed)
   Covid-19 Vaccination Clinic  Name:  Sarah Krueger    MRN: [id number] DOB: 08-Dec-1990  02/19/2020  Sarah Krueger was observed post Covid-19 immunization for 15 minutes without incident. She was provided with Vaccine Information Sheet and instruction to access the V-Safe system.   Sarah Krueger was instructed to call 911 with any severe reactions post vaccine: Marland Kitchen Difficulty breathing  . Swelling of face and throat  . A fast heartbeat  . A bad rash all over body  . Dizziness and weakness   Immunizations Administered    Name Date Dose VIS Date Route   Pfizer COVID-19 Vaccine 02/19/2020  7:22 PM 0.3 mL 11/26/2018 Intramuscular   Manufacturer: [employer], [employer]   Lot: [id number]   NDC: [address]-9

## 2020-03-11 ENCOUNTER — Ambulatory Visit: Payer: Medicaid Other | Attending: Internal Medicine

## 2020-04-10 ENCOUNTER — Ambulatory Visit: Payer: Self-pay

## 2024-06-25 ENCOUNTER — Encounter: Payer: Self-pay | Admitting: Emergency Medicine

## 2024-06-25 ENCOUNTER — Other Ambulatory Visit: Payer: Self-pay

## 2024-06-25 ENCOUNTER — Emergency Department
Admission: EM | Admit: 2024-06-25 | Discharge: 2024-06-25 | Disposition: A | Discharge status: Home / Self Care | Attending: Emergency Medicine | Admitting: Emergency Medicine

## 2024-06-25 ENCOUNTER — Emergency Department

## 2024-06-25 DIAGNOSIS — R030 Elevated blood-pressure reading, without diagnosis of hypertension: Secondary | ICD-10-CM | POA: Diagnosis not present

## 2024-06-25 DIAGNOSIS — S5401XA Injury of ulnar nerve at forearm level, right arm, initial encounter: Secondary | ICD-10-CM | POA: Insufficient documentation

## 2024-06-25 DIAGNOSIS — W108XXA Fall (on) (from) other stairs and steps, initial encounter: Secondary | ICD-10-CM | POA: Diagnosis not present

## 2024-06-25 DIAGNOSIS — S4991XA Unspecified injury of right shoulder and upper arm, initial encounter: Secondary | ICD-10-CM | POA: Diagnosis present

## 2024-06-25 MED ORDER — ACETAMINOPHEN 325 MG PO TABS
650.0000 mg | ORAL_TABLET | Freq: Once | ORAL | Status: AC
  Administered 2024-06-25: 650 mg via ORAL
  Filled 2024-06-25: qty 2

## 2024-06-25 MED ORDER — MELOXICAM 15 MG PO TABS: 15.0000 mg | ORAL_TABLET | Freq: Every day | ORAL | 0 refills | Status: AC

## 2024-06-25 MED ORDER — IBUPROFEN 600 MG PO TABS
600.0000 mg | ORAL_TABLET | Freq: Once | ORAL | Status: AC
  Administered 2024-06-25: 600 mg via ORAL
  Filled 2024-06-25: qty 1

## 2024-06-25 NOTE — ED Notes (Signed)
 PT's elbow wrapped with ace wrap per PA.

## 2024-06-25 NOTE — Discharge Instructions (Addendum)
 You were seen in the emergency department for numbness and pain to your right middle ring and pinky finger that extends up your arm.  I believe you likely have an injury of one of the nerves of your arm after hitting your elbow on the ground.  Please pick up and take the medication as prescribed.  Please do not take ibuprofen , Advil , Aleve, naproxen, aspirin, Motrin  or any other NSAID while on this medication.  Please stop taking this medication if you experience stomach cramping.  I will have you follow-up with an orthopedist for your pain to see if they can do nerve conduction studies; please call their office to schedule an appointment.  You may wear the ACE wrap as needed.  Please pick up one of the elbow braces at your local drugstore or off [employer].  I have given you a picture of it below.  I have also given you a list of resources to establish care with a primary care provider.  Please read through them and give them their offices a call.

## 2024-06-25 NOTE — ED Provider Notes (Signed)
 [facility] Provider Note    Event Date/Time   First MD Initiated Contact with Patient 06/25/24 1259     (approximate)   History   Elbow Pain   HPI  Sarah Krueger is a 33 y.o. female  with no significant past medical history presents to the emergency department with right elbow and forearm pain with intermittent numbness in her right middle finger and ring finger and posterior medial right forearm after a fall 8 days ago.  Patient states she was going down her steps when she slipped and fell onto her right elbow.  Denies hitting her head or loss of consciousness or any other injury.  Denies shoulder pain or neck pain.  Patient states she has tried warm compresses and Tylenol  at home without much pain relief.  Patient is right-hand dominant and does use her hands daily at work.     Physical Exam   Triage Vital Signs: ED Triage Vitals [06/25/24 1238]  Encounter Vitals Group     BP (!) 142/102     Girls Systolic BP Percentile      Girls Diastolic BP Percentile      Boys Systolic BP Percentile      Boys Diastolic BP Percentile      Pulse Rate 62     Resp 17     Temp 98.1 F (36.7 C)     Temp Source Oral     SpO2 100 %     Weight 180 lb (81.6 kg)     Height 5' 2 (1.575 m)     Head Circumference      Peak Flow      Pain Score 7     Pain Loc      Pain Education      Exclude from Growth Chart     Most recent vital signs: Vitals:   06/25/24 1305 06/25/24 1512  BP:  (!) 138/92  Pulse:  60  Resp:  18  Temp:    SpO2: 100% 100%    General: Awake, in no acute distress. Appears stated age. Head: Normocephalic, atraumatic. Neck: Supple, no lymphadenopathy, no nuchal rigidity.  No midline cervical tenderness. CV: Good peripheral perfusion. Cap refill <2 sec. Radial pulses 2+ b/l. Respiratory:Normal respiratory effort.  No respiratory distress.  GI: Soft, non-distended. MSK: Normal ROM and 5/5 strength in b/l elbows, wrists and fingers. Endorses  pain with gripping and wrist flexion of the right arm and hand.  Skin:Warm, dry, intact. No rashes, lesions, or ecchymosis. No cyanosis or pallor.  No swelling, erythema or warmth of the elbow or arm. Neurological: A&Ox4 to person, place, time, and situation. Sensation intact and equal to b/l upper extremities. Strength symmetric. No focal deficits.   ED Results / Procedures / Treatments   Labs (all labs ordered are listed, but only abnormal results are displayed) Labs Reviewed - No data to display   EKG     RADIOLOGY Right elbow x-ray ordered.   PROCEDURES:  Critical Care performed: No   Procedures   MEDICATIONS ORDERED IN ED: Medications  acetaminophen  (TYLENOL ) tablet 650 mg (650 mg Oral Given 06/25/24 1351)  ibuprofen  (ADVIL ) tablet 600 mg (600 mg Oral Given 06/25/24 1351)     IMPRESSION / MDM / ASSESSMENT AND PLAN / ED COURSE  I reviewed the triage vital signs and the nursing notes.  Differential diagnosis includes, but is not limited to, ulnar nerve injury, elbow fracture or dislocation, lateral versus medial epicondylitis  Patient's presentation is most consistent with acute complicated illness / injury requiring diagnostic workup.  Patient is a 33 year old female presenting today with right elbow and forearm pain as well as numbness in her ulnar distribution of her right hand and forearm.  X-ray was ordered, negative for any acute fracture or dislocation. I independently viewed the x-ray and radiologist's report.  I agree with the radiologist's report.  Did give her a dose of Tylenol  and ibuprofen  here in the emergency department.  Did discuss at home remedies with Ace wrap provided and meloxicam  prescription given.  Precautions regarding taking this medication discussed with the patient.  Encouraged her to purchase an elbow counterforce brace or regular brace.  Did give her a work note for today.  Will have her follow-up with a neurologist  for possible nerve conduction studies if her symptoms do not subside.  Also gave her primary care resources and referral to establish care given her elevated blood pressure reading and not having an established primary care provider.  The patient may return to the emergency department for any new, worsening, or concerning symptoms. Patient was given the opportunity to ask questions; all questions were answered. Emergency department return precautions were discussed with the patient.  Patient is in agreement to the treatment plan.  Patient is stable for discharge.   FINAL CLINICAL IMPRESSION(S) / ED DIAGNOSES   Final diagnoses:  Injury of right ulnar nerve, unspecified injury location, initial encounter  Elevated blood pressure reading     Rx / DC Orders   ED Discharge Orders          Ordered    meloxicam  (MOBIC ) 15 MG tablet  Daily        06/25/24 1504    Ambulatory Referral to Primary Care (Establish Care)        06/25/24 1505             Note:  This document was prepared using Dragon voice recognition software and may include unintentional dictation errors.     Sheron Salm, PA-C 06/25/24 1550    Levander Slate, MD 06/25/24 [phone]

## 2024-06-25 NOTE — ED Triage Notes (Signed)
 Patient to ED via POV for right elbow pain. States she fell last Tuesday and hurting since. States intermittent numbness and sharp pains with movement.
